# Patient Record
Sex: Male | Born: 1975 | Race: White | Hispanic: No | Marital: Married | Smoking: Never smoker
Health system: Southern US, Community
[De-identification: ages and names within clinical notes are randomized; demographics above are authoritative.]

## PROBLEM LIST (undated history)

## (undated) DIAGNOSIS — E7800 Pure hypercholesterolemia, unspecified: Secondary | ICD-10-CM

## (undated) DIAGNOSIS — I10 Essential (primary) hypertension: Secondary | ICD-10-CM

## (undated) HISTORY — PX: SMALL INTESTINE SURGERY: SHX150

---

## 2013-10-29 ENCOUNTER — Emergency Department (HOSPITAL_COMMUNITY): Payer: Commercial Managed Care - PPO

## 2013-10-29 ENCOUNTER — Emergency Department (HOSPITAL_COMMUNITY)
Admission: EM | Admit: 2013-10-29 | Discharge: 2013-10-30 | Disposition: A | Discharge status: Home / Self Care | Payer: Commercial Managed Care - PPO | Attending: Emergency Medicine | Admitting: Emergency Medicine

## 2013-10-29 ENCOUNTER — Encounter (HOSPITAL_COMMUNITY): Payer: Self-pay | Admitting: Emergency Medicine

## 2013-10-29 DIAGNOSIS — Z79899 Other long term (current) drug therapy: Secondary | ICD-10-CM | POA: Insufficient documentation

## 2013-10-29 DIAGNOSIS — R310 Gross hematuria: Secondary | ICD-10-CM | POA: Diagnosis not present

## 2013-10-29 DIAGNOSIS — R300 Dysuria: Secondary | ICD-10-CM

## 2013-10-29 DIAGNOSIS — Z792 Long term (current) use of antibiotics: Secondary | ICD-10-CM | POA: Diagnosis not present

## 2013-10-29 DIAGNOSIS — R109 Unspecified abdominal pain: Secondary | ICD-10-CM | POA: Diagnosis present

## 2013-10-29 DIAGNOSIS — Z880 Allergy status to penicillin: Secondary | ICD-10-CM | POA: Insufficient documentation

## 2013-10-29 LAB — COMPREHENSIVE METABOLIC PANEL
ALK PHOS: 23 U/L — AB (ref 39–117)
ALT: 18 U/L (ref 0–53)
AST: 19 U/L (ref 0–37)
Albumin: 4.1 g/dL (ref 3.5–5.2)
Anion gap: 14 (ref 5–15)
BUN: 15 mg/dL (ref 6–23)
CO2: 24 mEq/L (ref 19–32)
Calcium: 9.2 mg/dL (ref 8.4–10.5)
Chloride: 99 mEq/L (ref 96–112)
Creatinine, Ser: 1.17 mg/dL (ref 0.50–1.35)
GFR, EST AFRICAN AMERICAN: 89 mL/min — AB (ref >90)
GFR, EST NON AFRICAN AMERICAN: 77 mL/min — AB (ref >90)
Glucose, Bld: 101 mg/dL — ABNORMAL HIGH (ref 70–99)
Potassium: 3.9 mEq/L (ref 3.7–5.3)
SODIUM: 137 meq/L (ref 137–147)
Total Bilirubin: 0.3 mg/dL (ref 0.3–1.2)
Total Protein: 7.9 g/dL (ref 6.0–8.3)

## 2013-10-29 LAB — URINALYSIS, ROUTINE W REFLEX MICROSCOPIC
Glucose, UA: 500 mg/dL — AB
Glucose, UA: 100 mg/dL — AB
KETONES UR: 40 mg/dL — AB
Ketones, ur: 40 mg/dL — AB
NITRITE: POSITIVE — AB
Nitrite: POSITIVE — AB
Protein, ur: >300 mg/dL — AB
Specific Gravity, Urine: 1.015 (ref 1.005–1.030)
Specific Gravity, Urine: 1.027 (ref 1.005–1.030)
UROBILINOGEN UA: 1 mg/dL (ref 0.0–1.0)
pH: 6.5 (ref 5.0–8.0)
pH: 5 (ref 5.0–8.0)

## 2013-10-29 LAB — URINE MICROSCOPIC-ADD ON

## 2013-10-29 LAB — CBC WITH DIFFERENTIAL/PLATELET
BASOS ABS: 0 10*3/uL (ref 0.0–0.1)
BASOS PCT: 0 % (ref 0–1)
EOS ABS: 0.2 10*3/uL (ref 0.0–0.7)
Eosinophils Relative: 3 % (ref 0–5)
HCT: 41.9 % (ref 39.0–52.0)
Hemoglobin: 14.4 g/dL (ref 13.0–17.0)
LYMPHS PCT: 28 % (ref 12–46)
Lymphs Abs: 2.7 10*3/uL (ref 0.7–4.0)
MCH: 30.5 pg (ref 26.0–34.0)
MCHC: 34.4 g/dL (ref 30.0–36.0)
MCV: 88.8 fL (ref 78.0–100.0)
MONO ABS: 1 10*3/uL (ref 0.1–1.0)
Monocytes Relative: 10 % (ref 3–12)
Neutro Abs: 5.7 10*3/uL (ref 1.7–7.7)
Neutrophils Relative %: 59 % (ref 43–77)
PLATELETS: 280 10*3/uL (ref 150–400)
RBC: 4.72 MIL/uL (ref 4.22–5.81)
RDW: 13 % (ref 11.5–15.5)
WBC: 9.7 10*3/uL (ref 4.0–10.5)

## 2013-10-29 LAB — GRAM STAIN

## 2013-10-29 MED ORDER — OXYCODONE-ACETAMINOPHEN 5-325 MG PO TABS: 2.0000 | ORAL_TABLET | Freq: Once | ORAL | Status: DC

## 2013-10-29 MED ORDER — PHENAZOPYRIDINE HCL 200 MG PO TABS: 200.0000 mg | ORAL_TABLET | Freq: Three times a day (TID) | ORAL | Status: DC | PRN

## 2013-10-29 NOTE — ED Notes (Signed)
The pt has had pain and bleeding from the penix since yesterday.  He was seen at [facility] yesterday and diagnosed with a uti.  He has had bright red blood clots when he urinates since and this afternoon he has been unable to void more than a trickle and that urine is bloody.  He took 2 percocets 1800

## 2013-10-29 NOTE — ED Provider Notes (Signed)
CSN: [id number]     Arrival date & time 10/29/13  1843 History   First MD Initiated Contact with Patient 10/29/13 2024     Chief Complaint  Patient presents with  . Flank Pain   Patient is a 38 y.o. male presenting with dysuria. The history is provided by the patient.  Dysuria This is a new problem. The current episode started yesterday. The problem occurs constantly. The problem has been unchanged. Associated symptoms include abdominal pain and urinary symptoms. Pertinent negatives include no chest pain, chills, coughing, fever, headaches, nausea, neck pain, rash, sore throat or vomiting. Exacerbated by: urinating. He has tried oral narcotics for the symptoms. The treatment provided mild relief.   Patient is a 38 year old Caucasian male with no significant past medical history who presents with painful hematuria that began yesterday. Patient states he was seen at an outside ED yesterday where he was diagnosed with a UTI and sent home with Cipro which she has been taking. Patient denies fevers chills or night sweats. He denies abdominal pain constipation or diarrhea. Patient states that he does not have pain until he goes to the bathroom and has a painful weak stream. Patient suspects that he has a kidney stone. She does not have a history of kidney stones. He denies flank pain.  History reviewed. No pertinent past medical history. History reviewed. No pertinent past surgical history. No family history on file. History  Substance Use Topics  . Smoking status: Never Smoker   . Smokeless tobacco: Not on file  . Alcohol Use: Yes    Review of Systems  Constitutional: Negative for fever and chills.  HENT: Negative for rhinorrhea and sore throat.   Eyes: Negative for visual disturbance.  Respiratory: Negative for cough and shortness of breath.   Cardiovascular: Negative for chest pain.  Gastrointestinal: Positive for abdominal pain. Negative for nausea, vomiting, diarrhea and constipation.   Genitourinary: Positive for dysuria. Negative for hematuria.  Musculoskeletal: Negative for back pain and neck pain.  Skin: Negative for rash.  Neurological: Negative for syncope and headaches.  Psychiatric/Behavioral: Negative for confusion.  All other systems reviewed and are negative.  Allergies  Bee venom and Penicillins  Home Medications   Prior to Admission medications   Medication Sig Start Date End Date Taking? Authorizing Provider  amLODipine (NORVASC) 5 MG tablet Take 5 mg by mouth daily.  08/13/13  Yes Historical Provider, MD  ciprofloxacin (CIPRO) 500 MG tablet Take 500 mg by mouth 2 (two) times daily.  10/28/13 11/06/13 Yes Historical Provider, MD  lisinopril-hydrochlorothiazide (PRINZIDE,ZESTORETIC) 20-12.5 MG per tablet Take 1 tablet by mouth daily.  08/13/13  Yes Historical Provider, MD  phenazopyridine (PYRIDIUM) 200 MG tablet Take 1 tablet (200 mg total) by mouth 3 (three) times daily as needed for pain. 10/29/13   Maris Berger, MD   BP 114/62  Pulse 54  Temp(Src) 98.4 F (36.9 C) (Oral)  Resp 17  Ht 5\' 10"  (1.778 m)  Wt 248 lb (112.492 kg)  BMI 35.58 kg/m2  SpO2 98%  Physical Exam  Constitutional: He is oriented to person, place, and time. He appears well-developed and well-nourished. No distress.  HENT:  Head: Normocephalic and atraumatic.  Mouth/Throat: Oropharynx is clear and moist.  Eyes: EOM are normal.  Neck: Neck supple. No JVD present.  Cardiovascular: Normal rate, regular rhythm, normal heart sounds and intact distal pulses.   Pulmonary/Chest: Effort normal and breath sounds normal.  Abdominal: Soft. He exhibits no distension. There is no tenderness.  Musculoskeletal:  Normal range of motion. He exhibits no edema.  Neurological: He is alert and oriented to person, place, and time. No cranial nerve deficit.  Skin: Skin is warm and dry.  Psychiatric: His behavior is normal.    ED Course  Procedures  None   Labs Review Labs Reviewed   URINALYSIS, ROUTINE W REFLEX MICROSCOPIC - Abnormal; Notable for the following:    Color, Urine RED (*)    APPearance TURBID (*)    Glucose, UA 500 (*)    Hgb urine dipstick LARGE (*)    Bilirubin Urine SMALL (*)    Ketones, ur 40 (*)    Protein, ur >300 (*)    Urobilinogen, UA >8.0 (*)    Nitrite POSITIVE (*)    Leukocytes, UA MODERATE (*)    All other components within normal limits  COMPREHENSIVE METABOLIC PANEL - Abnormal; Notable for the following:    Glucose, Bld 101 (*)    Alkaline Phosphatase 23 (*)    GFR calc non Af Amer 77 (*)    GFR calc Af Amer 89 (*)    All other components within normal limits  URINE MICROSCOPIC-ADD ON - Abnormal; Notable for the following:    Bacteria, UA FEW (*)    Casts HYALINE CASTS (*)    All other components within normal limits  GRAM STAIN  URINE CULTURE  CBC WITH DIFFERENTIAL    Imaging Review Ct Renal Stone Study  10/29/2013   CLINICAL DATA:  Dysuria and hematuria.  EXAM: CT ABDOMEN AND PELVIS WITHOUT CONTRAST  TECHNIQUE: Multidetector CT imaging of the abdomen and pelvis was performed following the standard protocol without IV contrast.  COMPARISON:  None.  FINDINGS: Liver normal. Spleen normal pancreas normal. Gallbladder nondistended. No biliary distention.  Adrenals normal. Right kidney unremarkable. 6.9 cm simple cyst left kidney. Increased density is noted on lower posterior bladder. This could represent stone debris. This could represent blood byproducts. A bladder mass cannot be entirely excluded.  No significant adenopathy.  Abdominal aorta normal in caliber.  The appendix is not visualized. No bowel distention. No free air. Stomach is nondistended. No mesenteric mass.  Lung bases are clear. Heart size normal. Sebaceous cyst subcutaneous tissue left buttock No acute bony abnormality.  IMPRESSION: 1. Prominent area of increased density noted in the posterior lower bladder. This could represent stone debris. This could represent blood  byproducts. A bladder mass cannot be entirely excluded. 2. 6.9 cm simple cyst left kidney.   Electronically Signed   By: Maisie Fushomas  Register   On: 10/29/2013 21:55    MDM   Final diagnoses:  Hematuria, gross  Dysuria    Well appearing 38 year old male in no distress. No suprapubic tenderness or flank tenderness. Urinalysis shows microscopic hematuria with positive nitrites, moderate leukocytes and few bacteria. Urine Gram stain and culture were obtained to (these were not obtained at yesterday's ED visit). Vision is afebrile, vitals stable. Urology consulted and recommended CT stone study. This did not show any obvious urolithiasis.  Bedside US reveals echogenic substances in the dependent portion of the bladder, possibly clot. Will place foley and irrigate. Foley placed without difficulty. Irrigated and multiple clots collected. After 2L of irrigation, fluid became more clear and less sedimentary. Patient will go home with foley and leg back. Patient instructed to f/u with Urology. Wife works for [employer] and prefers to see [employer] [facility]ist. Feel this is reasonable. Strict return precautions given [facility].  Pt and wife voice understanding. Pt stable for d/c.   Case discussed with Dr. Radford PaxBeaton.   Maris BergerJonah Makaiya Geerdes, MD 10/30/13 [phone]

## 2013-10-29 NOTE — ED Notes (Addendum)
Per Resident he did FAST [place] on pt and he has a "significant amount of urine".  Stats pt does not need bladder scan order completed.  Also, pt's discharge home has been changed to still in process.  Pt updated on plan by Resident to place foley.

## 2013-10-29 NOTE — ED Notes (Signed)
Pt took took percocet prior to arrival to ED

## 2013-10-29 NOTE — ED Notes (Signed)
Pt to CT

## 2013-10-29 NOTE — ED Notes (Signed)
MD at bedside. 

## 2013-10-30 MED ORDER — PHENAZOPYRIDINE HCL 200 MG PO TABS: 200.0000 mg | ORAL_TABLET | Freq: Three times a day (TID) | ORAL | Status: AC | PRN

## 2013-10-30 NOTE — ED Provider Notes (Signed)
I saw and evaluated the patient, reviewed the resident's note and I agree with the findings and plan.   .Face to face Exam:  General:  Awake HEENT:  Atraumatic Resp:  Normal effort Abd:  Nondistended Neuro:No focal weakness    Nelia Shiobert L Afomia Blackley, MD 10/30/13 2045

## 2013-10-31 LAB — URINE CULTURE
COLONY COUNT: NO GROWTH
Culture: NO GROWTH

## 2017-01-23 ENCOUNTER — Emergency Department (HOSPITAL_COMMUNITY): Payer: Commercial Managed Care - PPO

## 2017-01-23 ENCOUNTER — Emergency Department (HOSPITAL_COMMUNITY)
Admission: EM | Admit: 2017-01-23 | Discharge: 2017-01-24 | Disposition: A | Discharge status: Home / Self Care | Payer: Commercial Managed Care - PPO | Attending: Emergency Medicine | Admitting: Emergency Medicine

## 2017-01-23 ENCOUNTER — Encounter (HOSPITAL_COMMUNITY): Payer: Self-pay | Admitting: Nurse Practitioner

## 2017-01-23 ENCOUNTER — Other Ambulatory Visit: Payer: Self-pay

## 2017-01-23 DIAGNOSIS — R112 Nausea with vomiting, unspecified: Secondary | ICD-10-CM | POA: Diagnosis not present

## 2017-01-23 DIAGNOSIS — N281 Cyst of kidney, acquired: Secondary | ICD-10-CM | POA: Insufficient documentation

## 2017-01-23 DIAGNOSIS — R109 Unspecified abdominal pain: Secondary | ICD-10-CM

## 2017-01-23 DIAGNOSIS — R1032 Left lower quadrant pain: Secondary | ICD-10-CM | POA: Diagnosis present

## 2017-01-23 DIAGNOSIS — R310 Gross hematuria: Secondary | ICD-10-CM | POA: Diagnosis not present

## 2017-01-23 HISTORY — DX: Pure hypercholesterolemia, unspecified: E78.00

## 2017-01-23 HISTORY — DX: Essential (primary) hypertension: I10

## 2017-01-23 LAB — URINALYSIS, ROUTINE W REFLEX MICROSCOPIC
Bacteria, UA: NONE SEEN
Bilirubin Urine: NEGATIVE
Glucose, UA: 50 mg/dL — AB
Ketones, ur: 5 mg/dL — AB
Leukocytes, UA: NEGATIVE
Nitrite: NEGATIVE
Protein, ur: 30 mg/dL — AB
Specific Gravity, Urine: 1.014 (ref 1.005–1.030)
pH: 7 (ref 5.0–8.0)

## 2017-01-23 LAB — CBC WITH DIFFERENTIAL/PLATELET
Basophils Absolute: 0 10*3/uL (ref 0.0–0.1)
Basophils Relative: 0 %
Eosinophils Absolute: 0 10*3/uL (ref 0.0–0.7)
Eosinophils Relative: 0 %
HCT: 44.8 % (ref 39.0–52.0)
Hemoglobin: 14.9 g/dL (ref 13.0–17.0)
Lymphocytes Relative: 8 %
Lymphs Abs: 1.4 10*3/uL (ref 0.7–4.0)
MCH: 30.2 pg (ref 26.0–34.0)
MCHC: 33.3 g/dL (ref 30.0–36.0)
MCV: 90.9 fL (ref 78.0–100.0)
Monocytes Absolute: 0.8 10*3/uL (ref 0.1–1.0)
Monocytes Relative: 4 %
Neutro Abs: 15.5 10*3/uL — ABNORMAL HIGH (ref 1.7–7.7)
Neutrophils Relative %: 88 %
Platelets: 286 10*3/uL (ref 150–400)
RBC: 4.93 MIL/uL (ref 4.22–5.81)
RDW: 13 % (ref 11.5–15.5)
WBC: 17.7 10*3/uL — ABNORMAL HIGH (ref 4.0–10.5)

## 2017-01-23 LAB — BASIC METABOLIC PANEL
Anion gap: 12 (ref 5–15)
BUN: 13 mg/dL (ref 6–20)
CO2: 26 mmol/L (ref 22–32)
Calcium: 9.4 mg/dL (ref 8.9–10.3)
Chloride: 95 mmol/L — ABNORMAL LOW (ref 101–111)
Creatinine, Ser: 1.43 mg/dL — ABNORMAL HIGH (ref 0.61–1.24)
GFR calc Af Amer: >60 mL/min (ref >60)
GFR calc non Af Amer: 59 mL/min — ABNORMAL LOW (ref >60)
Glucose, Bld: 122 mg/dL — ABNORMAL HIGH (ref 65–99)
Potassium: 3.8 mmol/L (ref 3.5–5.1)
Sodium: 133 mmol/L — ABNORMAL LOW (ref 135–145)

## 2017-01-23 MED ORDER — OXYCODONE-ACETAMINOPHEN 5-325 MG PO TABS: 1.0000 | ORAL_TABLET | ORAL | Status: DC | PRN

## 2017-01-23 MED ORDER — ONDANSETRON 4 MG PO TBDP
4.0000 mg | ORAL_TABLET | Freq: Once | ORAL | Status: AC | PRN
  Administered 2017-01-23: 4 mg via ORAL
  Filled 2017-01-23: qty 1

## 2017-01-23 MED ORDER — KETOROLAC TROMETHAMINE 30 MG/ML IJ SOLN
30.0000 mg | Freq: Once | INTRAMUSCULAR | Status: AC
  Administered 2017-01-23: 30 mg via INTRAMUSCULAR
  Filled 2017-01-23: qty 1

## 2017-01-23 NOTE — ED Triage Notes (Signed)
Pt endorses severe left flank pain started today. Pt notes that when he urinated this afternoon noted tea colored urine and some bright blood the next time he urinated. Pt endorses nausea, and 3 episodes of vomiting. Denies diarrhea. Pt appears obviously in pain.

## 2017-01-23 NOTE — ED Provider Notes (Signed)
Medical screening exam  42 year old male presents today with complaints of right flank pain.  Patient notes acute onset around 930 this morning, blood in his urine around 1130.  He reports this feels similar to an episode where he was seen in the emergency room with suspected past kidney stone.  Patient notes some pain radiating to his left testicle, denies any testicular pain to palpation.  Patient does note some urinary frequency yesterday.  He denies any fever or vomiting.  No significant abdominal pain.  Suspicion for nephrolithiasis highway in this patient given his acute onset of symptoms and presentation.  Patient medically screened in triage, CT scan ordered, laboratory analysis ordered, patient will need acute bed.   Vitals:   01/23/17 1938  BP: (!) 176/100  Pulse: (!) 101  Resp: (!) 22  Temp: 98.6 F (37 C)  SpO2: 97%     Eyvonne MechanicHedges, Meliss Fleek, PA-C 01/23/17 2143    Tilden Fossaees, Elizabeth, MD 01/24/17 1255

## 2017-01-24 MED ORDER — OXYCODONE-ACETAMINOPHEN 5-325 MG PO TABS: 1.0000 | ORAL_TABLET | ORAL | 0 refills | Status: AC | PRN

## 2017-01-24 NOTE — ED Notes (Signed)
Patient denies pain and is resting comfortably.  

## 2017-01-24 NOTE — Discharge Instructions (Signed)
There is an abnormal finding on your CT showing an undefined cyst in the left kidney of unknown significance. Follow up with urology for further evaluation of bloody urine and renal cyst. Percocet for pain as needed. You can take Tylenol if needed for additional pain. If Tylenol is needed, take only 650 mg as there is also Tylenol in the Percocet. Return here with any severe pain, fever or new concern.

## 2017-01-24 NOTE — ED Notes (Signed)
Pt wife to nurses station questioning why they dont have any results from blood work, CT, Catering manageretc. Explained to wife they are still waiting for a room and to be seen by a doctor and that pt could technically be out in the waiting area instead of triage holding where they wouldve have any results either. Pt wife states "this is horrible care, an update would be nice"

## 2017-01-24 NOTE — ED Notes (Signed)
Pt called for vitals update, no answer. 

## 2019-07-03 IMAGING — CT CT RENAL STONE PROTOCOL
2 of 4 series · 16 of 46 positions shown, 18 images · non-contrast
Comparison: 10/29/2013 CT

CLINICAL DATA: Severe left flank pain starting today with
hematuria.

EXAM:
CT ABDOMEN AND PELVIS WITHOUT CONTRAST
TECHNIQUE: Multidetector CT imaging of the abdomen and pelvis was performed
following the standard protocol without IV contrast.

[Series 3: stone study 5.0 i30f 2 · axial · 0.98mm/px · z∈[+576,+1096]mm · 13 of 114 slices shown, 15 images]
[im 5/114  soft-tissue]
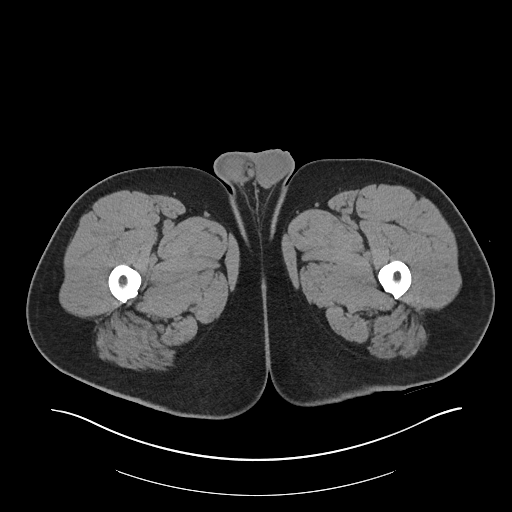
[im 5/114  bone]
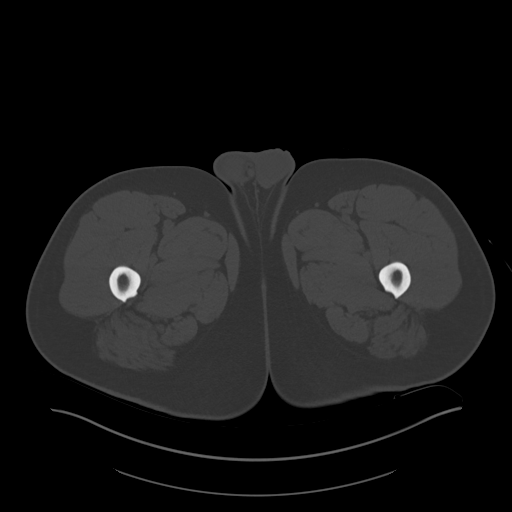
[im 14/114  soft-tissue]
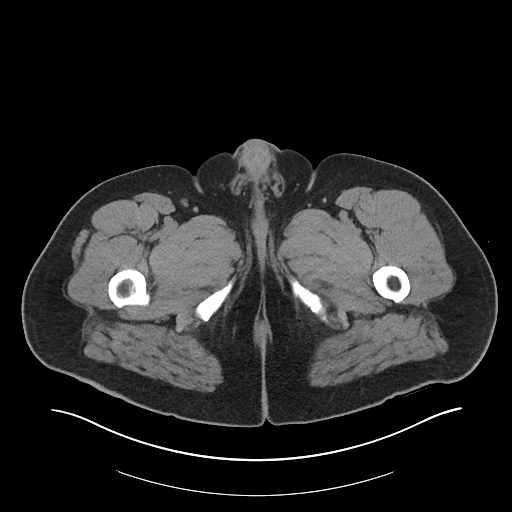
[im 23/114  soft-tissue]
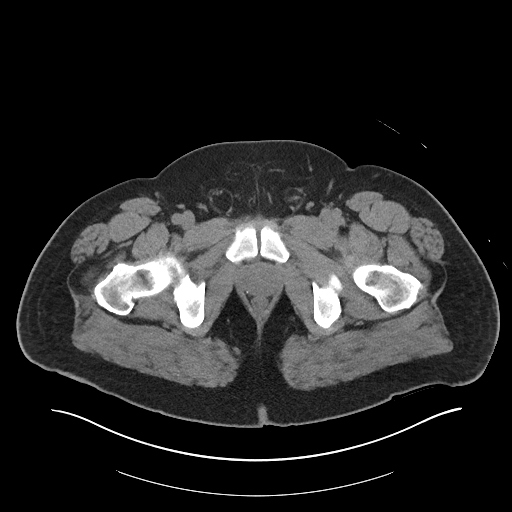
[im 32/114  soft-tissue]
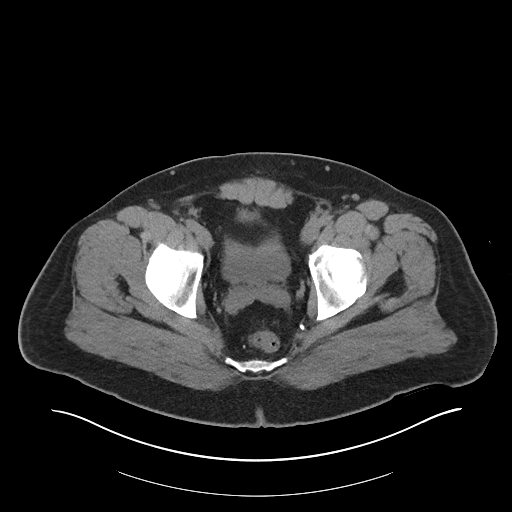
[im 41/114  soft-tissue]
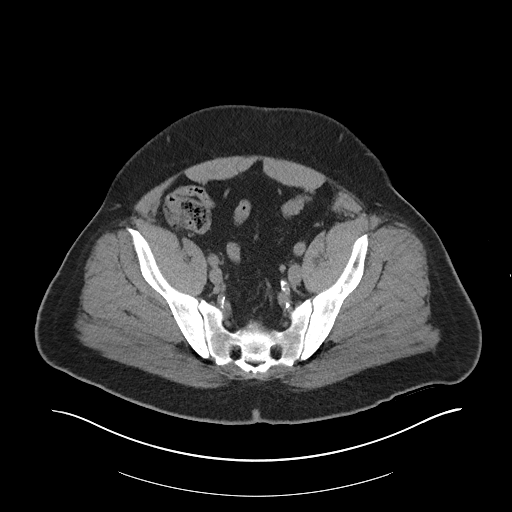
[im 50/114  soft-tissue]
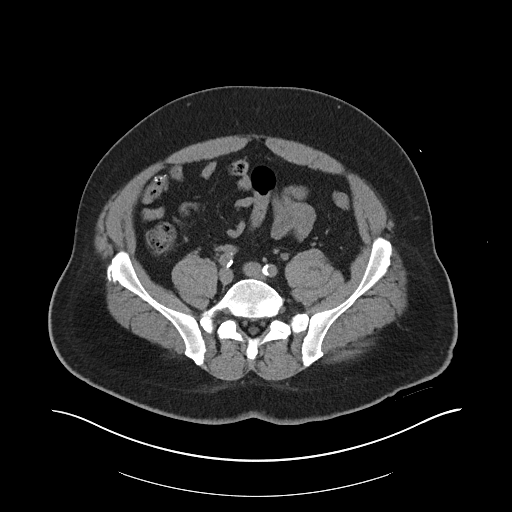
[im 59/114  soft-tissue]
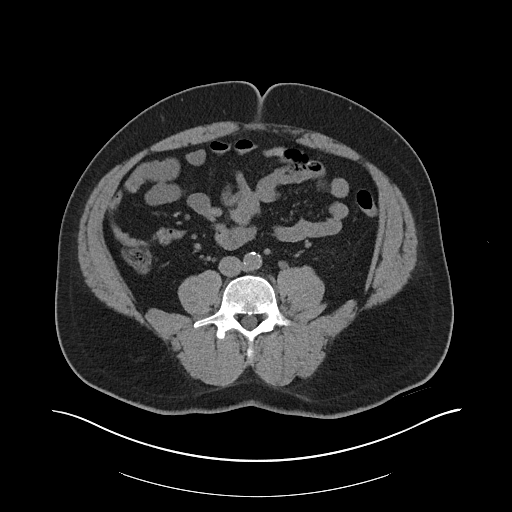
[im 64/114  soft-tissue]
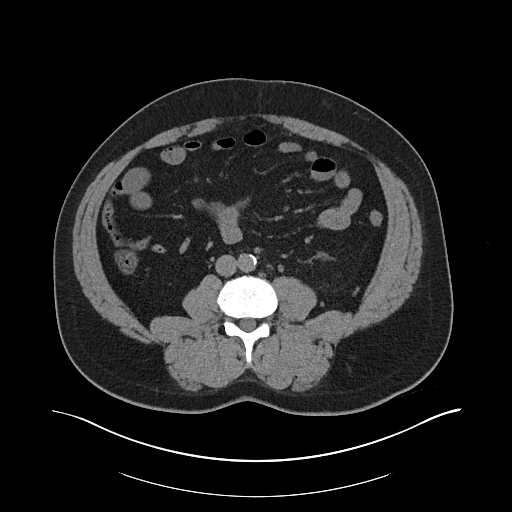
[im 73/114  soft-tissue]
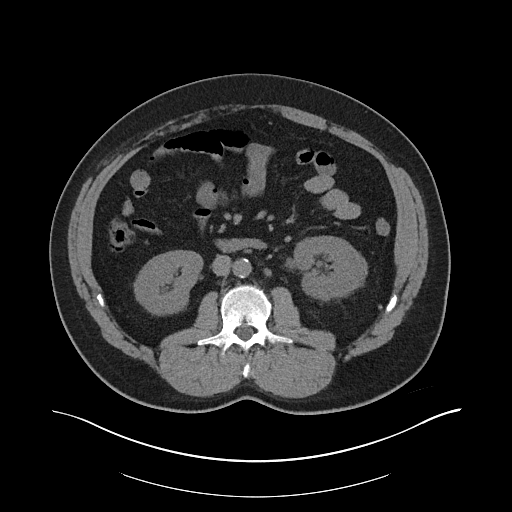
[im 73/114  bone]
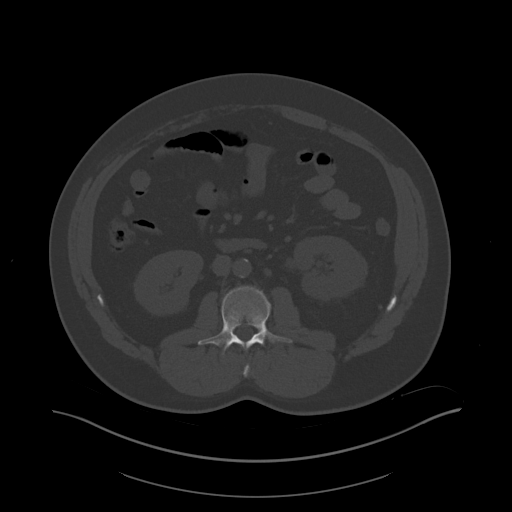
[im 82/114  soft-tissue]
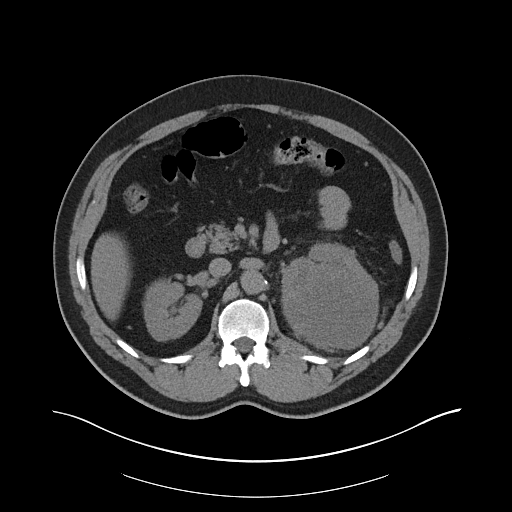
[im 91/114  soft-tissue]
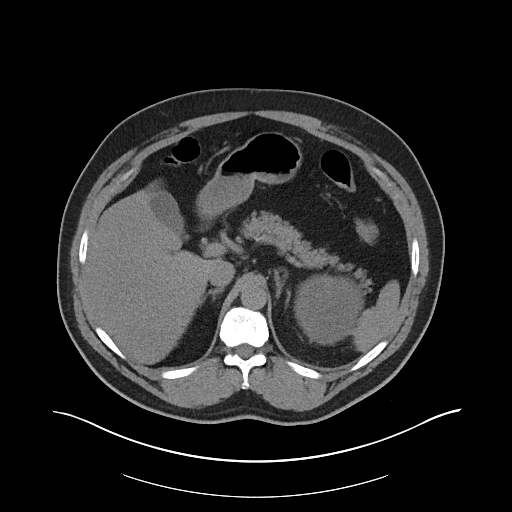
[im 100/114  soft-tissue]
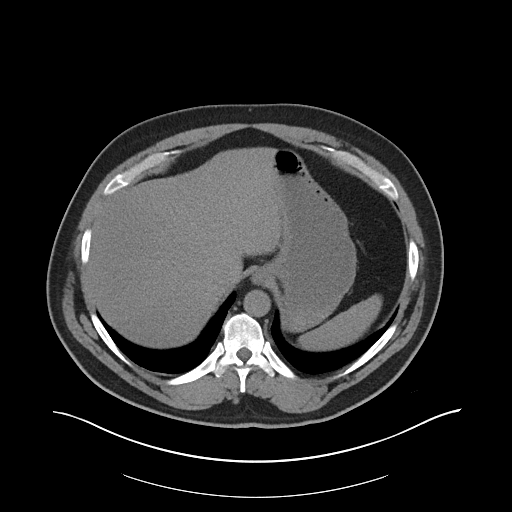
[im 109/114  soft-tissue]
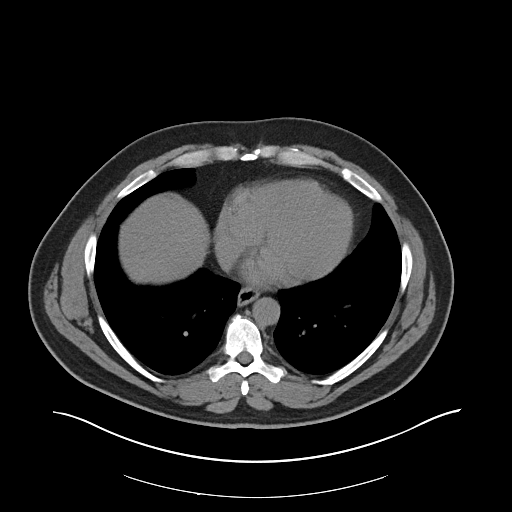

[Series 6: coronal soft tissue · coronal · 0.92mm/px · 3 of 111 slices shown]
[im 37/111  soft-tissue]
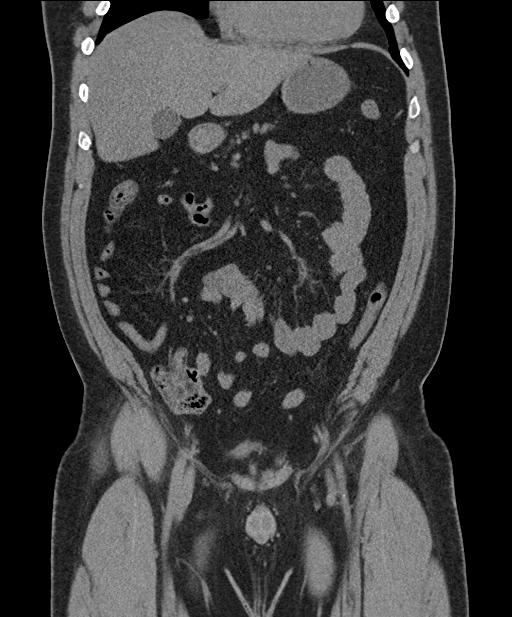
[im 49/111  soft-tissue]
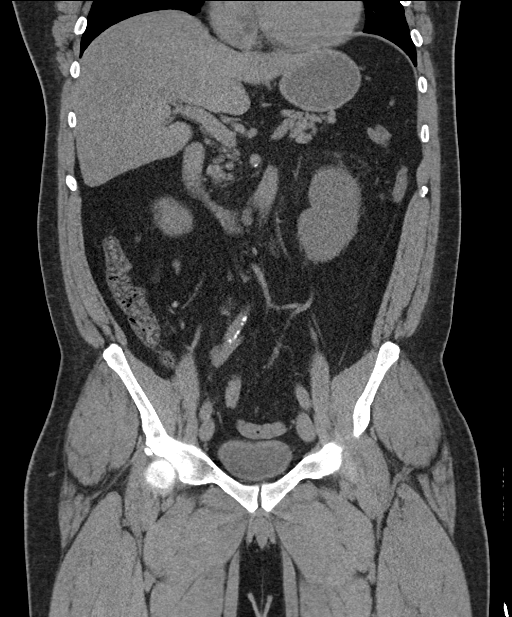
[im 62/111  soft-tissue]
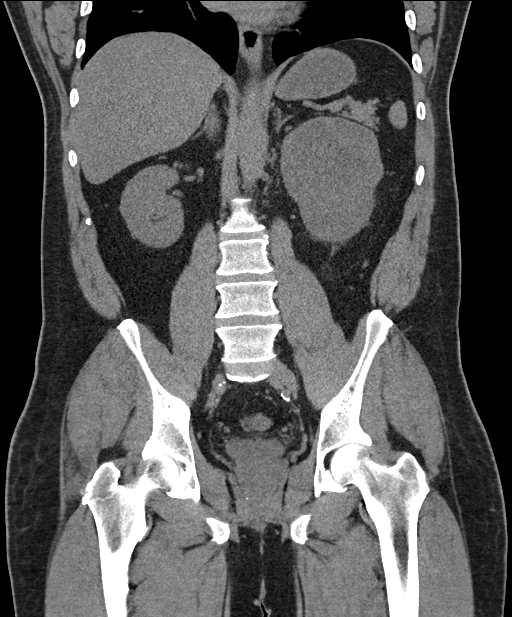

[16 of 46 positions shown; findings below may reference images not displayed]

FINDINGS: Lower chest: Top-normal heart size.  Clear lung bases.

Hepatobiliary: No biliary dilatation. The unenhanced liver is
unremarkable. No space-occupying mass is identified given
limitations of a noncontrast study. Gallbladder is nondistended and
free of calculi.

Pancreas: Normal

Spleen: Normal

Adrenals/Urinary Tract: Normal bilateral adrenal glands. An
approximately 9 cm cystic mass of the upper pole the left kidney has
increased in size from 6.4 cm. Of concern however is suggestion of
subtle mural thickening and/or nodularity within this cystic mass
since prior. CT or MRI with contrast is recommended for further
evaluation. The bladder is nondistended. No intraluminal mass or
clots are apparent on this unenhanced study.

Stomach/Bowel: Small bowel anastomosis in the right lower quadrant.
No bowel obstruction or inflammation. Normal nondistended appearance
of the stomach.

Vascular/Lymphatic: Aortoiliac and branch vessel atherosclerosis.
Atherosclerosis. No enlarged abdominal or pelvic lymph nodes.

Reproductive: Seminal vesicles and prostate are unremarkable.

Other: No abdominal wall hernia or abnormality. No abdominopelvic
ascites.

Musculoskeletal: No acute or significant osseous findings.
IMPRESSION: Interval increase in size of a cystic mass associated with the upper
pole of the left kidney with suggestion of eccentric mural
thickening or debris noted within. CT or MRI with IV contrast is
recommended for further correlation. No intraluminal mass or blood
products identified within the nondistended urinary bladder.
